# Patient Record
Sex: Female | Born: 1970 | Race: Black or African American | Hispanic: No | Marital: Single | Smoking: Never smoker
Health system: Southern US, Community
[De-identification: ages and names within clinical notes are randomized; demographics above are authoritative.]

## PROBLEM LIST (undated history)

## (undated) DIAGNOSIS — I10 Essential (primary) hypertension: Secondary | ICD-10-CM

## (undated) DIAGNOSIS — E669 Obesity, unspecified: Secondary | ICD-10-CM

## (undated) HISTORY — PX: TUBAL LIGATION: SHX77

## (undated) HISTORY — DX: Essential (primary) hypertension: I10

## (undated) HISTORY — DX: Obesity, unspecified: E66.9

---

## 2000-06-10 ENCOUNTER — Emergency Department (HOSPITAL_COMMUNITY): Admission: EM | Admit: 2000-06-10 | Discharge: 2000-06-10 | Payer: Self-pay | Admitting: Emergency Medicine

## 2010-07-06 ENCOUNTER — Ambulatory Visit: Payer: Self-pay | Admitting: Diagnostic Radiology

## 2010-07-06 ENCOUNTER — Emergency Department (HOSPITAL_BASED_OUTPATIENT_CLINIC_OR_DEPARTMENT_OTHER): Admission: EM | Admit: 2010-07-06 | Discharge: 2010-07-06 | Payer: Self-pay | Admitting: Emergency Medicine

## 2010-11-20 ENCOUNTER — Encounter: Payer: Self-pay | Admitting: Internal Medicine

## 2013-09-02 ENCOUNTER — Telehealth: Payer: Self-pay

## 2013-09-02 NOTE — Telephone Encounter (Signed)
Opened in error

## 2015-05-24 ENCOUNTER — Other Ambulatory Visit: Payer: Self-pay | Admitting: Family Medicine

## 2015-05-24 DIAGNOSIS — R29898 Other symptoms and signs involving the musculoskeletal system: Secondary | ICD-10-CM

## 2015-05-25 ENCOUNTER — Other Ambulatory Visit: Payer: Self-pay

## 2015-05-26 ENCOUNTER — Other Ambulatory Visit: Payer: Self-pay

## 2018-01-14 ENCOUNTER — Other Ambulatory Visit: Payer: Self-pay | Admitting: Family Medicine

## 2018-01-14 DIAGNOSIS — M79661 Pain in right lower leg: Secondary | ICD-10-CM

## 2018-01-14 DIAGNOSIS — M7989 Other specified soft tissue disorders: Principal | ICD-10-CM

## 2018-01-15 ENCOUNTER — Ambulatory Visit
Admission: RE | Admit: 2018-01-15 | Discharge: 2018-01-15 | Disposition: A | Discharge status: Home / Self Care | Payer: 59 | Source: Ambulatory Visit | Attending: Family Medicine | Admitting: Family Medicine

## 2018-01-15 DIAGNOSIS — M79661 Pain in right lower leg: Secondary | ICD-10-CM

## 2018-01-15 DIAGNOSIS — M7989 Other specified soft tissue disorders: Principal | ICD-10-CM

## 2018-10-14 IMAGING — US US EXTREM LOW VENOUS*R*
1 series · 14 of 24 positions shown · non-contrast
Comparison: None

CLINICAL DATA: Swelling x1 week

EXAM:
RIGHT LOWER EXTREMITY VENOUS DOPPLER ULTRASOUND
TECHNIQUE: Gray-scale sonography with compression, as well as color and duplex
ultrasound, were performed to evaluate the deep venous system from
the level of the common femoral vein through the popliteal and
proximal calf veins.

[Series 1: us extrem low venous*right* · 0.08mm/px · 14 of 36 slices shown]
[im 1/36]
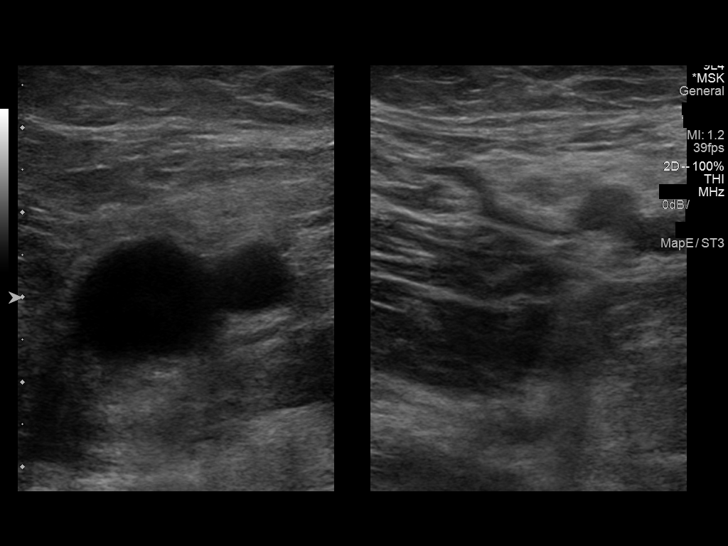
[im 4/36]
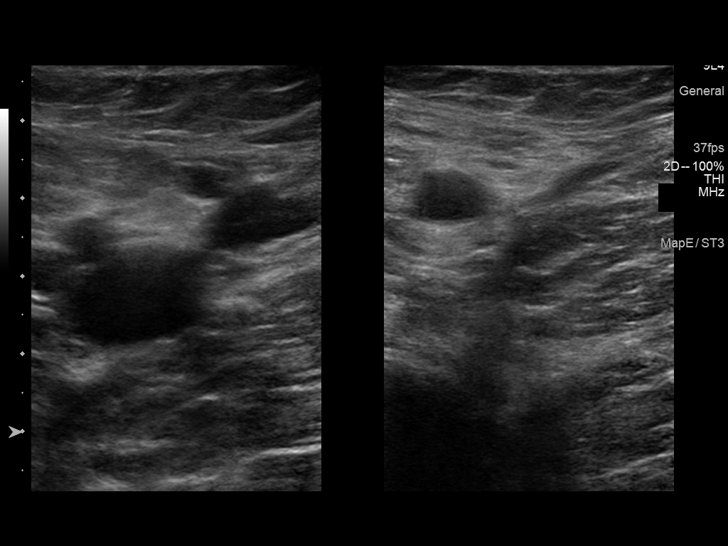
[im 7/36]
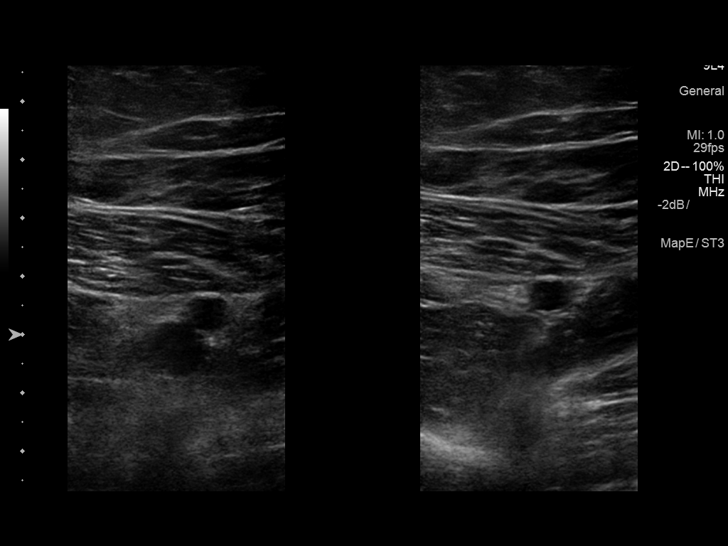
[im 10/36]
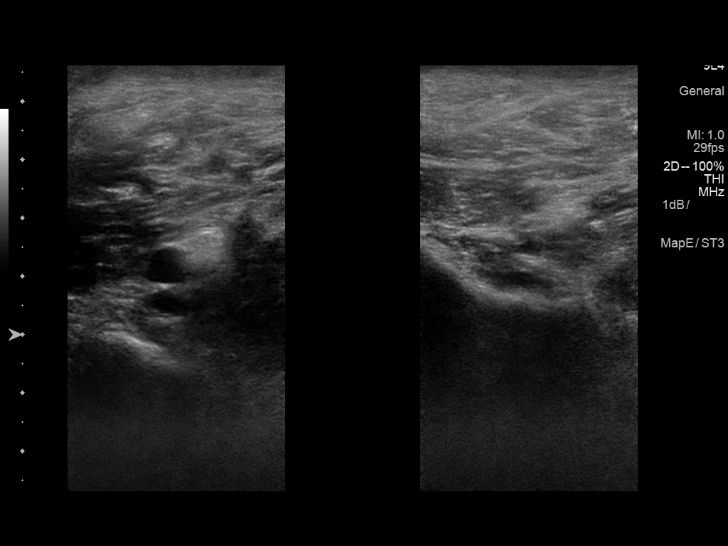
[im 11/36]
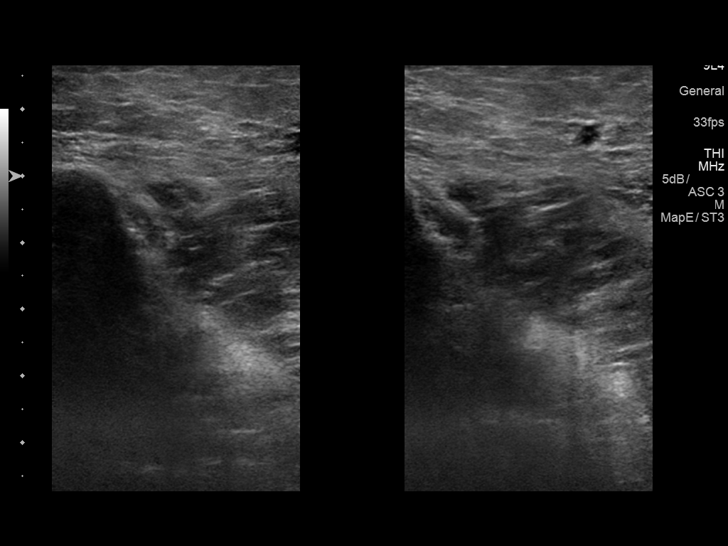
[im 14/36]
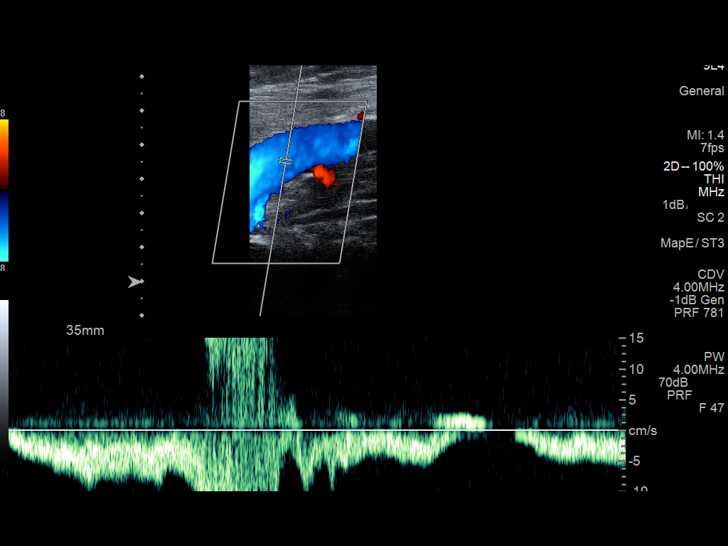
[im 17/36]
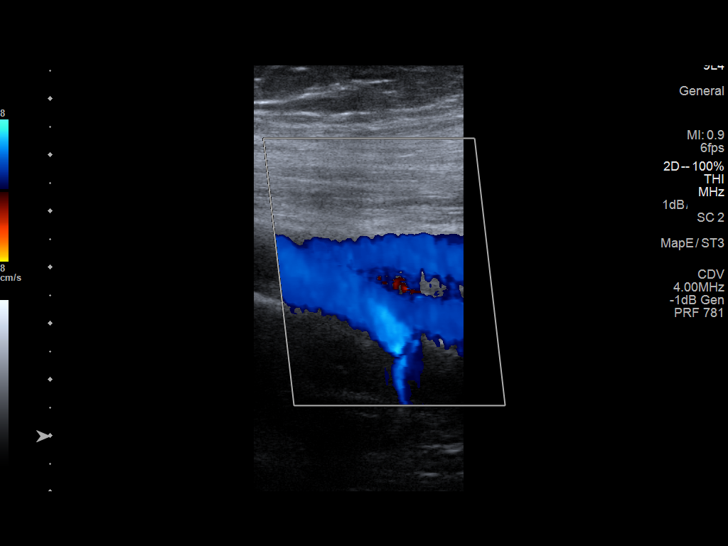
[im 19/36]
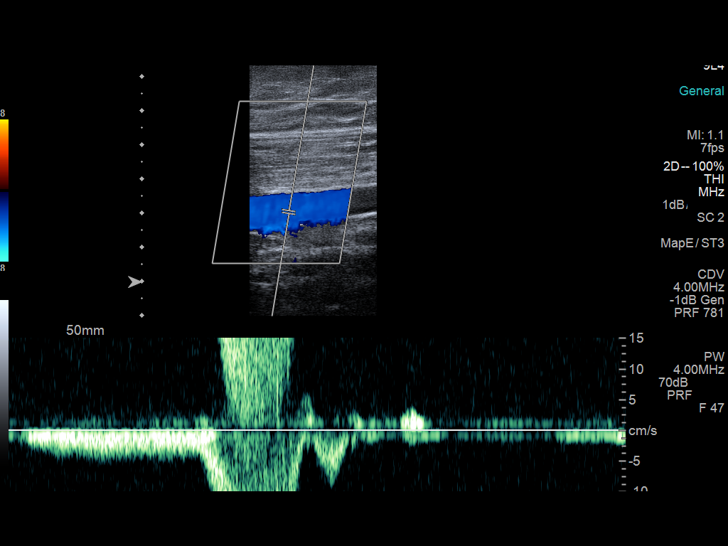
[im 22/36]
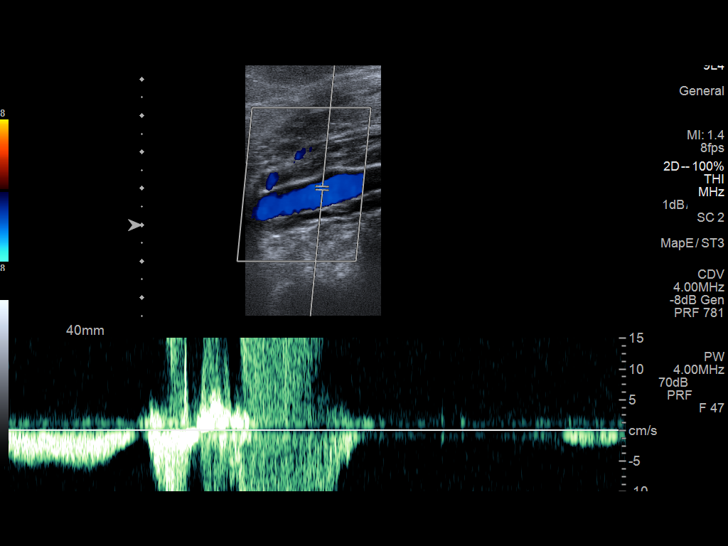
[im 25/36]
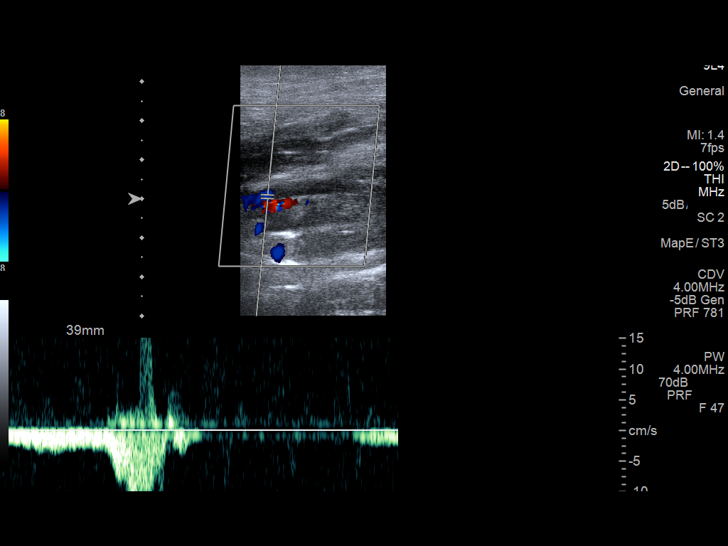
[im 28/36]
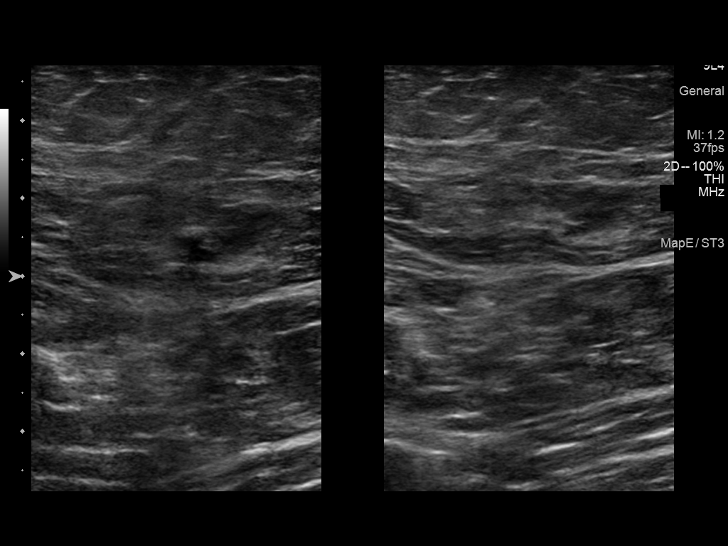
[im 29/36]
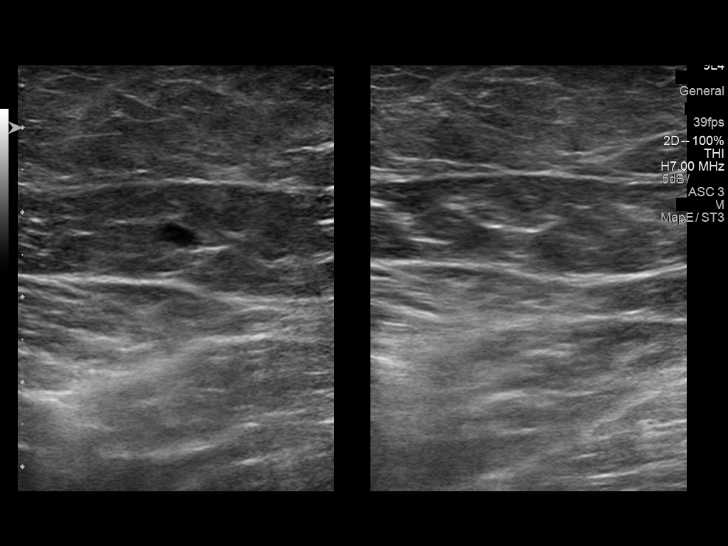
[im 32/36]
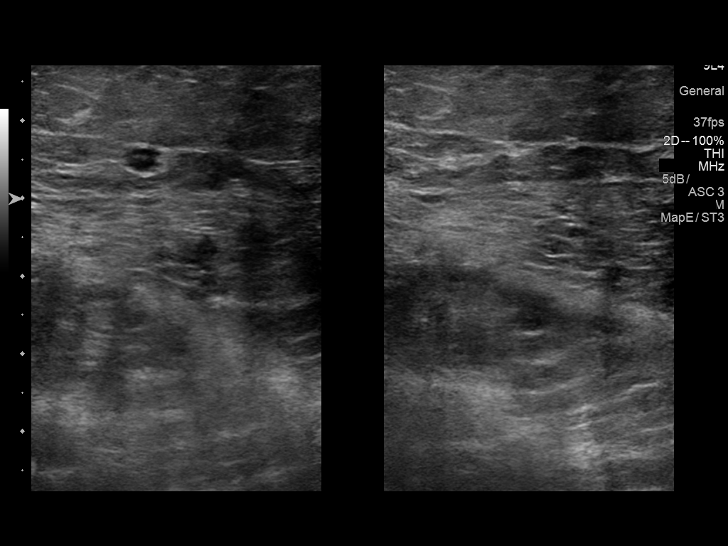
[im 36/36]
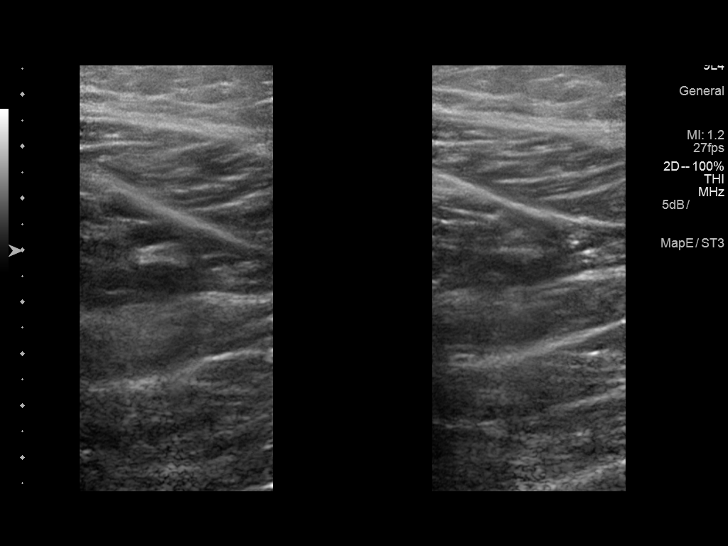

[14 of 24 positions shown; findings below may reference images not displayed]

FINDINGS: Normal compressibility of the common femoral, superficial femoral,
and popliteal veins, as well as the proximal calf veins. No filling
defects to suggest DVT on grayscale or color Doppler imaging.
Doppler waveforms show normal direction of venous flow, normal
respiratory phasicity and response to augmentation. Visualized
segments of the saphenous venous system normal in caliber and
compressibility. Survey views of the contralateral common femoral
vein are unremarkable.
IMPRESSION: No evidence of  lower extremity deep vein thrombosis, RIGHT.

## 2019-05-01 ENCOUNTER — Other Ambulatory Visit: Payer: Self-pay | Admitting: Family Medicine

## 2019-05-01 ENCOUNTER — Ambulatory Visit
Admission: RE | Admit: 2019-05-01 | Discharge: 2019-05-01 | Disposition: A | Discharge status: Home / Self Care | Payer: 59 | Source: Ambulatory Visit | Attending: Family Medicine | Admitting: Family Medicine

## 2019-05-01 DIAGNOSIS — M7660 Achilles tendinitis, unspecified leg: Secondary | ICD-10-CM

## 2020-03-20 ENCOUNTER — Ambulatory Visit: Payer: 59 | Attending: Internal Medicine

## 2020-03-20 DIAGNOSIS — Z23 Encounter for immunization: Secondary | ICD-10-CM

## 2020-03-20 NOTE — Progress Notes (Signed)
   Covid-19 Vaccination Clinic  Name:  ROENA SASSAMAN    MRN: [id number] DOB: July 06, 1971  03/20/2020  Ms. Paolella was observed post Covid-19 immunization for 15 minutes without incident. She was provided with Vaccine Information Sheet and instruction to access the V-Safe system.   Ms. Riding was instructed to call 911 with any severe reactions post vaccine: Marland Kitchen Difficulty breathing  . Swelling of face and throat  . A fast heartbeat  . A bad rash all over body  . Dizziness and weakness   Immunizations Administered    Name Date Dose VIS Date Route   Pfizer COVID-19 Vaccine 03/20/2020 10:31 AM 0.3 mL 12/24/2018 Intramuscular   Manufacturer: [employer], [employer]   Lot: [id number]   NDC: [address]-5

## 2020-04-17 ENCOUNTER — Ambulatory Visit: Payer: 59 | Attending: Internal Medicine

## 2020-04-17 DIAGNOSIS — Z23 Encounter for immunization: Secondary | ICD-10-CM

## 2020-04-17 NOTE — Progress Notes (Signed)
   Covid-19 Vaccination Clinic  Name:  HANAAN GANCARZ    MRN: [id number] DOB: 1971-09-07  04/17/2020  Ms. Godley was observed post Covid-19 immunization for 15 minutes without incident. She was provided with Vaccine Information Sheet and instruction to access the V-Safe system.   Ms. Parmer was instructed to call 911 with any severe reactions post vaccine: Marland Kitchen Difficulty breathing  . Swelling of face and throat  . A fast heartbeat  . A bad rash all over body  . Dizziness and weakness   Immunizations Administered    Name Date Dose VIS Date Route   Pfizer COVID-19 Vaccine 04/17/2020  9:45 AM 0.3 mL 12/24/2018 Intramuscular   Manufacturer: [employer], [employer]   Lot: [id number]   NDC: [address]-8

## 2020-07-10 IMAGING — CR LEFT OS CALCIS - 2+ VIEW
2 series · 2 of 2 positions shown · non-contrast
Comparison: None.

CLINICAL DATA: Left heel Achilles pain for the past 2 months.

EXAM:
LEFT OS CALCIS - 2+ VIEW

[x calcaneus lat left]
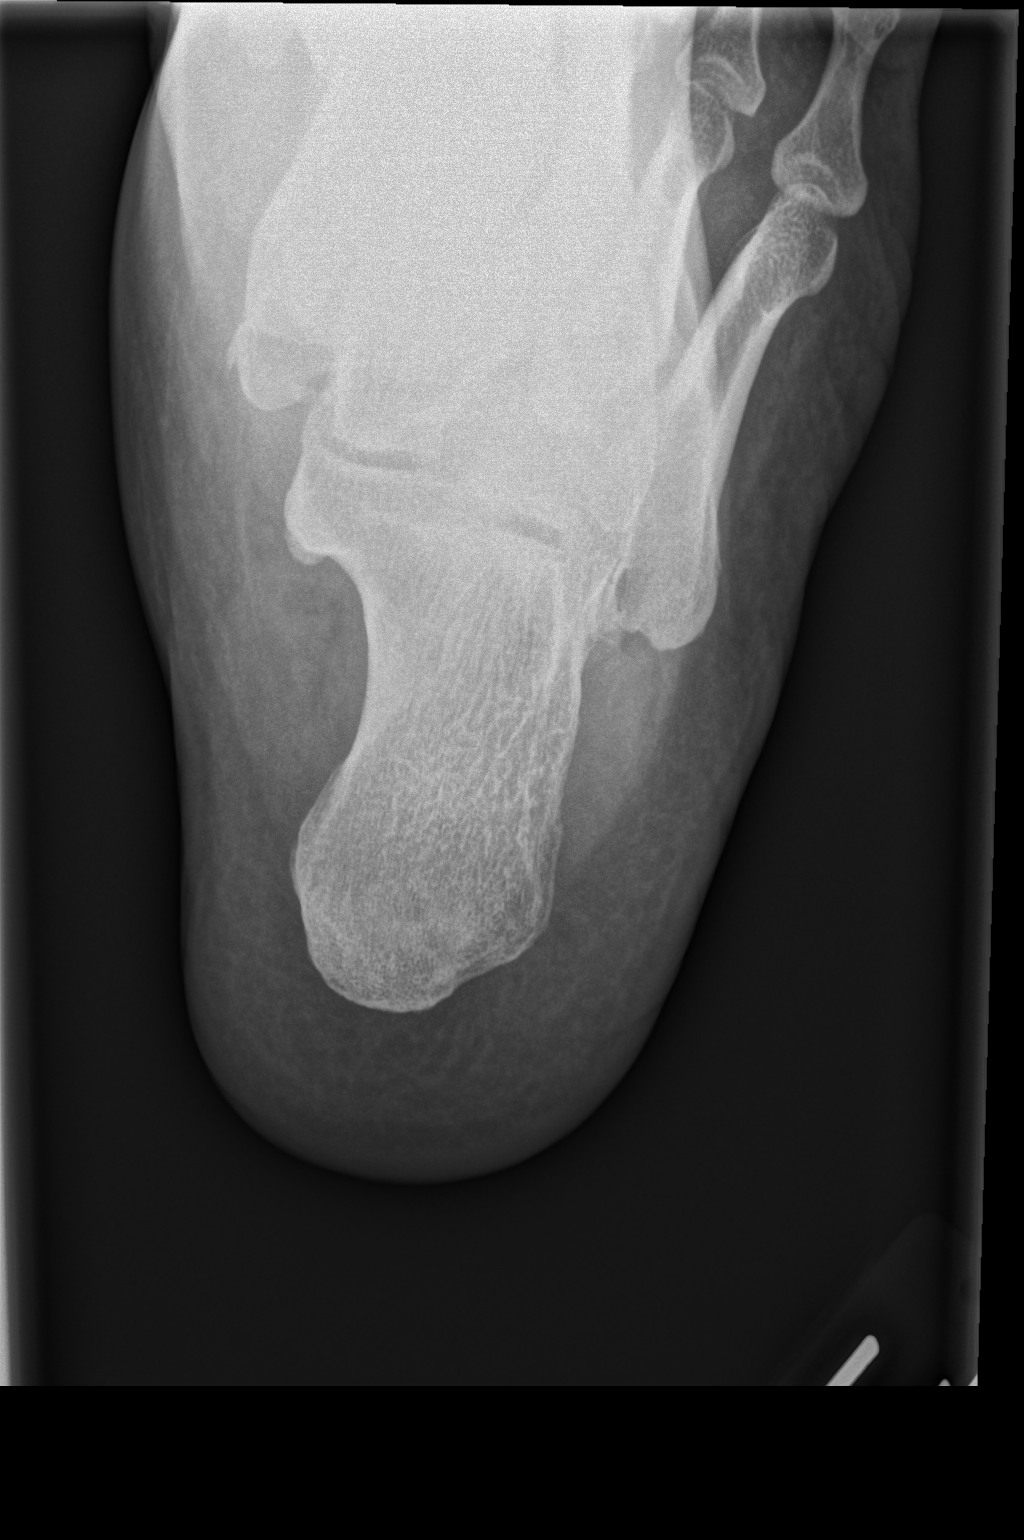

[x calcaneus axial left]
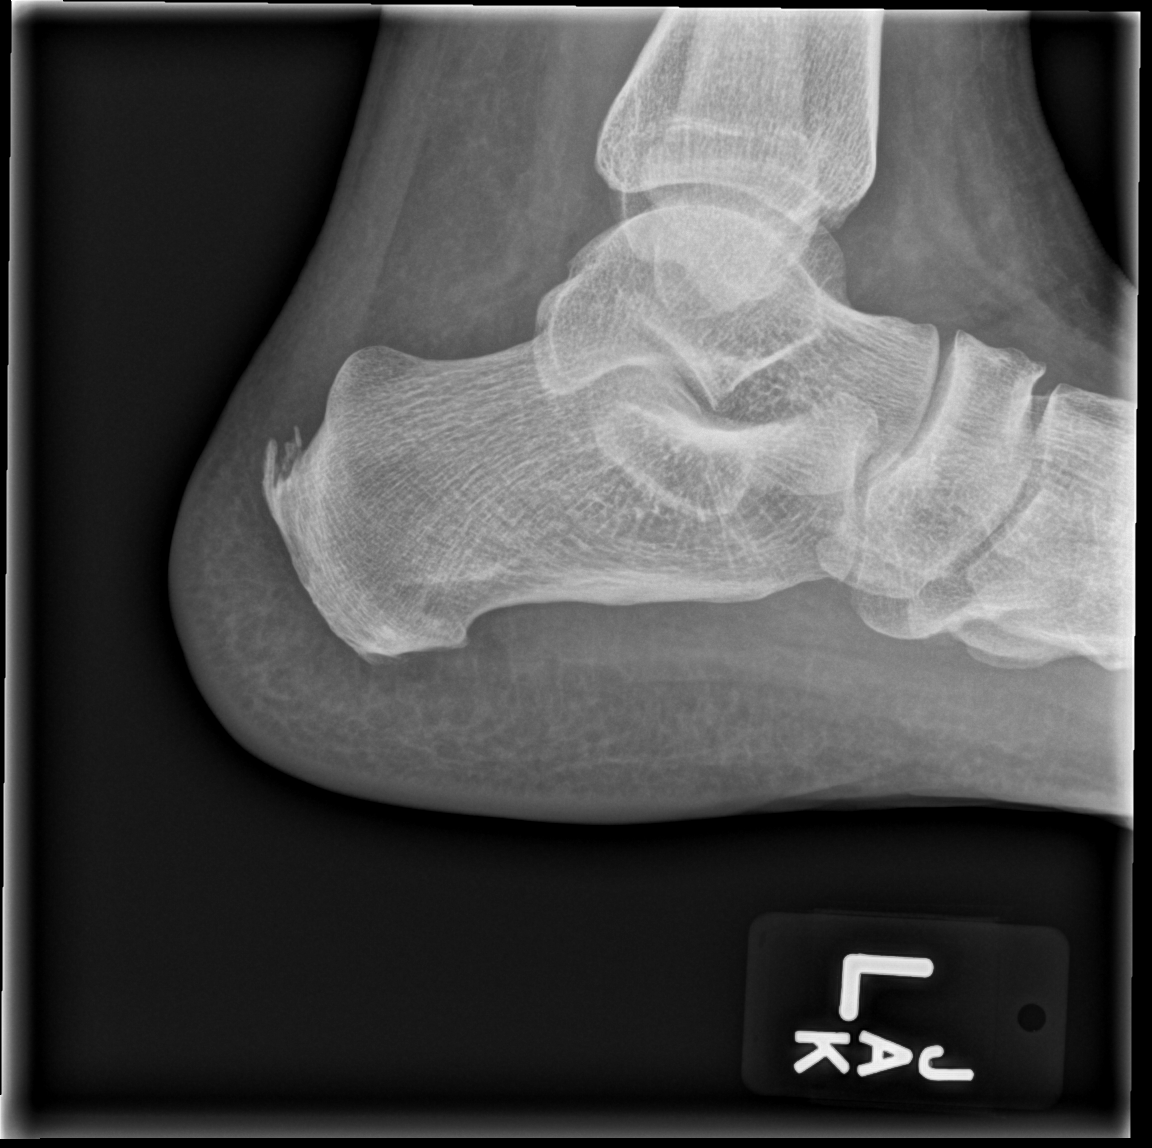

[2 of 2 positions shown; findings below may reference images not displayed]

FINDINGS: There is no evidence of fracture or other focal bone lesions. Bone
mineralization is normal. Achilles enthesopathy. Soft tissues are
unremarkable.
IMPRESSION: 1. Achilles enthesopathy.  No acute osseous abnormality.

## 2021-09-21 ENCOUNTER — Encounter: Payer: Self-pay | Admitting: Pulmonary Disease

## 2021-09-21 ENCOUNTER — Other Ambulatory Visit: Payer: Self-pay

## 2021-09-21 ENCOUNTER — Ambulatory Visit (INDEPENDENT_AMBULATORY_CARE_PROVIDER_SITE_OTHER): Payer: 59 | Admitting: Pulmonary Disease

## 2021-09-21 VITALS — BP 122/84 | HR 70 | Temp 97.9°F | Ht 64.0 in | Wt 215.6 lb

## 2021-09-21 DIAGNOSIS — R0602 Shortness of breath: Secondary | ICD-10-CM

## 2021-09-21 DIAGNOSIS — R140 Abdominal distension (gaseous): Secondary | ICD-10-CM | POA: Diagnosis not present

## 2021-09-21 DIAGNOSIS — R198 Other specified symptoms and signs involving the digestive system and abdomen: Secondary | ICD-10-CM | POA: Diagnosis not present

## 2021-09-21 NOTE — Progress Notes (Signed)
Synopsis: Referred in Nov 2022 for SOB by Richmond Campbell., PA-C  Subjective:   PATIENT ID: Katie Monroe GENDER: female DOB: [birth year], MRN: [id number]  Chief Complaint  Patient presents with   Consult    Patient says she's been having shortness of breath. Says she has to do several deep breaths to get her breathing back on track    Pmh of HTN, obesity, she started in Feb 2022, with episodes of SOB. Usually the OSB just goes away on its own. Sometimes she feels better if she just sits and rest. Denies chest tightness. No issues with swallowing food. The episodes are independent of eating. If she relaxes it goes away. She was stressed out in Feb at the time but it has continued. Has had second hand smoke exposure. She works at home for [employer]. No pets in the house.  Patient denies fevers chills night sweats weight loss.  Denies hemoptysis.  Denies sputum production.     Past Medical History:  Diagnosis Date   HTN (hypertension)    Obese      Family History  Problem Relation Age of Onset   Hypertension Mother    COPD Mother      Past Surgical History:  Procedure Laterality Date   TUBAL LIGATION      Social History   Socioeconomic History   Marital status: Single    Spouse name: Not on file   Number of children: Not on file   Years of education: Not on file   Highest education level: Not on file  Occupational History   Not on file  Tobacco Use   Smoking status: Never   Smokeless tobacco: Never  Substance and Sexual Activity   Alcohol use: Not on file   Drug use: Not on file   Sexual activity: Not on file  Other Topics Concern   Not on file  Social History Narrative   Not on file   Social Determinants of Health   Financial Resource Strain: Not on file  Food Insecurity: Not on file  Transportation Needs: Not on file  Physical Activity: Not on file  Stress: Not on file  Social Connections: Not on file  Intimate Partner Violence: Not on  file     Not on File   Outpatient Medications Prior to Visit  Medication Sig Dispense Refill   triamcinolone ointment (KENALOG) 0.1 % ataa bid     atenolol (TENORMIN) 25 MG tablet      ATENOLOL PO Take by mouth.     No facility-administered medications prior to visit.    Review of Systems  Constitutional:  Negative for chills, fever, malaise/fatigue and weight loss.  HENT:  Negative for hearing loss, sore throat and tinnitus.   Eyes:  Negative for blurred vision and double vision.  Respiratory:  Positive for shortness of breath. Negative for cough, hemoptysis, sputum production, wheezing and stridor.   Cardiovascular:  Negative for chest pain, palpitations, orthopnea, leg swelling and PND.  Gastrointestinal:  Positive for abdominal pain. Negative for constipation, diarrhea, heartburn, nausea and vomiting.  Genitourinary:  Negative for dysuria, hematuria and urgency.  Musculoskeletal:  Negative for joint pain and myalgias.  Skin:  Negative for itching and rash.  Neurological:  Negative for dizziness, tingling, weakness and headaches.  Endo/Heme/Allergies:  Negative for environmental allergies. Does not bruise/bleed easily.  Psychiatric/Behavioral:  Negative for depression. The patient is not nervous/anxious and does not have insomnia.   All other systems reviewed and are  negative.   Objective:  Physical Exam Vitals reviewed.  Constitutional:      General: She is not in acute distress.    Appearance: She is well-developed. She is obese.  HENT:     Head: Normocephalic and atraumatic.  Eyes:     General: No scleral icterus.    Conjunctiva/sclera: Conjunctivae normal.     Pupils: Pupils are equal, round, and reactive to light.  Neck:     Vascular: No JVD.     Trachea: No tracheal deviation.  Cardiovascular:     Rate and Rhythm: Normal rate and regular rhythm.     Heart sounds: Normal heart sounds. No murmur heard. Pulmonary:     Effort: Pulmonary effort is normal. No  tachypnea, accessory muscle usage or respiratory distress.     Breath sounds: Normal breath sounds. No stridor. No wheezing, rhonchi or rales.  Abdominal:     General: There is no distension.     Palpations: Abdomen is soft.     Tenderness: There is no abdominal tenderness.  Musculoskeletal:        General: No tenderness.     Cervical back: Neck supple.  Lymphadenopathy:     Cervical: No cervical adenopathy.  Skin:    General: Skin is warm and dry.     Capillary Refill: Capillary refill takes less than 2 seconds.     Findings: No rash.  Neurological:     Mental Status: She is alert and oriented to person, place, and time.  Psychiatric:        Behavior: Behavior normal.     Vitals:   09/21/21 1038  BP: 122/84  Pulse: 70  Temp: 97.9 F (36.6 C)  TempSrc: Oral  SpO2: 97%  Weight: 215 lb 9.6 oz (97.8 kg)  Height: 5\' 4"  (1.626 m)   97% on RA BMI Readings from Last 3 Encounters:  09/21/21 37.01 kg/m   Wt Readings from Last 3 Encounters:  09/21/21 215 lb 9.6 oz (97.8 kg)     CBC No results found for: WBC, RBC, HGB, HCT, PLT, MCV, MCH, MCHC, RDW, LYMPHSABS, MONOABS, EOSABS, BASOSABS    Chest Imaging: No recent imaging   Pulmonary Functions Testing Results: No flowsheet data found.  FeNO:   Pathology:  Echocardiogram:   Heart Catheterization:     Assessment & Plan:     ICD-10-CM   1. SOB (shortness of breath)  R06.02     2. Bloating  R14.0     3. Abdominal fullness  R19.8       Discussion: This is a 50 year old female with episodic shortness of breath.  It is associated with upper gastric abdominal fullness pressure sensation.  Sometimes she wakes up with this and sometimes it happens intermittently throughout the week.  She does not have really any other significant respiratory symptoms that are associated with it until the pressure sensation resolves.  Overall from the story I am not sure that this is related to her lungs but potentially more related  to what ever is going on in her belly.  She feels like it might be related to bloating or gas retention as well.  Plan: We talked about investigating her lungs further versus a more conservative approach. We talked about the utility of pulmonary function tests with a history of no significant lung disease or other respiratory symptoms I suspect that she would have normal PFTs but we did offer this. We also talked about imaging of the belly and chest which may  give [place] some more information. However her symptoms she has not had in over a week and a half. She would like to follow this and see if her symptoms just go away or if they get worse she is going to let us know and we can proceed with further investigation. We appreciate the consultation. Patient return to see [place] as needed.   Current Outpatient Medications:    triamcinolone ointment (KENALOG) 0.1 %, ataa bid, Disp: , Rfl:    atenolol (TENORMIN) 25 MG tablet, , Disp: , Rfl:    ATENOLOL PO, Take by mouth., Disp: , Rfl:    Garner Nash, DO [facility] 09/21/2021 10:57 AM

## 2021-09-21 NOTE — Patient Instructions (Signed)
Thank you for visiting Dr. Tonia Brooms at [facility]. Today we recommend the following:  Call us if you need [place] or your symptoms worsen   Return if symptoms worsen or fail to improve.    Please do your part to reduce the spread of COVID-19.
